# Patient Record
Sex: Male | Born: 1955 | Race: White | Hispanic: No | Marital: Married | Smoking: Never smoker
Health system: Southern US, Community
[De-identification: ages and names within clinical notes are randomized; demographics above are authoritative.]

## PROBLEM LIST (undated history)

## (undated) DIAGNOSIS — T7840XA Allergy, unspecified, initial encounter: Secondary | ICD-10-CM

## (undated) DIAGNOSIS — R011 Cardiac murmur, unspecified: Secondary | ICD-10-CM

## (undated) HISTORY — DX: Allergy, unspecified, initial encounter: T78.40XA

## (undated) HISTORY — DX: Cardiac murmur, unspecified: R01.1

---

## 1998-11-01 ENCOUNTER — Emergency Department (HOSPITAL_COMMUNITY): Admission: EM | Admit: 1998-11-01 | Discharge: 1998-11-01 | Payer: Self-pay | Admitting: Emergency Medicine

## 1998-11-04 ENCOUNTER — Emergency Department (HOSPITAL_COMMUNITY): Admission: EM | Admit: 1998-11-04 | Discharge: 1998-11-04 | Payer: Self-pay | Admitting: Emergency Medicine

## 1998-11-08 ENCOUNTER — Encounter (HOSPITAL_COMMUNITY): Admission: RE | Admit: 1998-11-08 | Discharge: 1999-02-06 | Payer: Self-pay | Admitting: Internal Medicine

## 1999-01-08 ENCOUNTER — Ambulatory Visit (HOSPITAL_COMMUNITY): Admission: RE | Admit: 1999-01-08 | Discharge: 1999-01-08 | Payer: Self-pay | Admitting: Gastroenterology

## 1999-12-24 ENCOUNTER — Encounter: Payer: Self-pay | Admitting: General Surgery

## 1999-12-24 ENCOUNTER — Encounter: Admission: RE | Admit: 1999-12-24 | Discharge: 1999-12-24 | Payer: Self-pay | Admitting: General Surgery

## 1999-12-25 ENCOUNTER — Ambulatory Visit (HOSPITAL_BASED_OUTPATIENT_CLINIC_OR_DEPARTMENT_OTHER): Admission: RE | Admit: 1999-12-25 | Discharge: 1999-12-26 | Payer: Self-pay | Admitting: General Surgery

## 2004-08-17 ENCOUNTER — Encounter: Admission: RE | Admit: 2004-08-17 | Discharge: 2004-11-15 | Payer: Self-pay | Admitting: Internal Medicine

## 2011-11-06 ENCOUNTER — Other Ambulatory Visit: Payer: Self-pay | Admitting: Gastroenterology

## 2013-04-30 ENCOUNTER — Ambulatory Visit: Payer: 59 | Admitting: Internal Medicine

## 2013-04-30 VITALS — BP 152/84 | HR 76 | Temp 99.3°F | Resp 16 | Ht 70.0 in | Wt 232.0 lb

## 2013-04-30 DIAGNOSIS — K047 Periapical abscess without sinus: Secondary | ICD-10-CM

## 2013-04-30 DIAGNOSIS — H9209 Otalgia, unspecified ear: Secondary | ICD-10-CM

## 2013-04-30 MED ORDER — AMOXICILLIN 500 MG PO CAPS: 1000.0000 mg | ORAL_CAPSULE | Freq: Two times a day (BID) | ORAL | Status: AC

## 2013-04-30 NOTE — Progress Notes (Signed)
   Subjective:    Patient ID: Jesus Cole, male    DOB: Jun 29, 1955, 58 y.o.   MRN: [id number]  HPI Patient reports with a 3-4 day history of bilateral ear pain.  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  He picks the scabs which leaves raw areas then he puts cotton in his ears at night and it sticks to the raw areas.  His reports soreness in this ears going back behind the ears and into his left lower jaw.  He denies any mouth/tooth injury or sores.   He reports a history of UTI's with a bottle of cipro 500 mg in the home which he took 1 tab BID for 4 days ending yesterday with no improvement.   He reports fatigue, chills, and irritability and aching in the ear/jaw area.  He denies hearing loss, tinnitus, nasal congestion, rhinorrhea, sinus pain/pressure, and post nasal drip.  Review of Systems  Constitutional: Positive for fever, chills and fatigue. Negative for activity change and appetite change.  HENT: Positive for ear discharge and ear pain. Negative for congestion, dental problem, hearing loss, mouth sores, postnasal drip, rhinorrhea, sinus pressure, sore throat, tinnitus and trouble swallowing.   Eyes: Negative.   Respiratory: Negative.       Objective:   Physical Exam  Constitutional: He appears well-developed and well-nourished. No distress.  HENT:  Head: Normocephalic and atraumatic.  Right Ear: Tympanic membrane normal. No drainage.  Left Ear: Tympanic membrane normal. No drainage.  Nose: Nose normal.  Mouth/Throat: Oropharynx is clear and moist. No oropharyngeal exudate.  Partial cerumen impaction bilaterlly. Right external canal injected. Tenderness molar roots right upper and lower//no abscess seen  Eyes: Conjunctivae and EOM are normal. Pupils are equal, round, and reactive to light. Right eye exhibits no discharge. Left eye exhibits no discharge. No scleral icterus.      Assessment & Plan:   1.  Otalgia But no active disease in the ear canals or tympanic membrane  2. Dental abscess Possibly both upper and lower aspects of the right side  Meds ordered this encounter  Medications  . amoxicillin (AMOXIL) 500 MG capsule    Sig: Take 2 capsules (1,000 mg total) by mouth 2 (two) times daily.    Dispense:  40 capsule    Refill:  0  Followup with dentist  I have completed the patient encounter in its entirety as documented by the FNP-S, with editing by me where necessary. Indiana Gamero P. Merla Richesoolittle, M.D.

## 2014-02-13 ENCOUNTER — Ambulatory Visit (INDEPENDENT_AMBULATORY_CARE_PROVIDER_SITE_OTHER): Payer: 59 | Admitting: Physician Assistant

## 2014-02-13 VITALS — BP 118/74 | HR 80 | Temp 99.6°F | Resp 16 | Ht 71.25 in | Wt 232.0 lb

## 2014-02-13 DIAGNOSIS — N390 Urinary tract infection, site not specified: Secondary | ICD-10-CM

## 2014-02-13 DIAGNOSIS — R300 Dysuria: Secondary | ICD-10-CM

## 2014-02-13 DIAGNOSIS — R319 Hematuria, unspecified: Secondary | ICD-10-CM

## 2014-02-13 LAB — POCT URINALYSIS DIPSTICK
Bilirubin, UA: NEGATIVE
Glucose, UA: NEGATIVE
Ketones, UA: NEGATIVE
Nitrite, UA: NEGATIVE
Protein, UA: NEGATIVE
Spec Grav, UA: 1.01
Urobilinogen, UA: 0.2
pH, UA: 6

## 2014-02-13 LAB — POCT UA - MICROSCOPIC ONLY
Casts, Ur, LPF, POC: NEGATIVE
Crystals, Ur, HPF, POC: NEGATIVE
Mucus, UA: NEGATIVE
Yeast, UA: NEGATIVE

## 2014-02-13 MED ORDER — CIPROFLOXACIN HCL 500 MG PO TABS: 500.0000 mg | ORAL_TABLET | Freq: Two times a day (BID) | ORAL | Status: AC

## 2014-02-13 NOTE — Patient Instructions (Signed)
Please let your pcp know that you are being treated.   We will contact you about the cultures, if the ciprofloxacin is not covering your medicaitons.

## 2014-02-13 NOTE — Progress Notes (Addendum)
MRN: [id number] DOB: 1955/05/12  Subjective:   Chief Complaint  Patient presents with  . Back Pain  . Urinary Frequency    Jesus Cole is a 58 y.o. non-smoking male presenting for urgency but voiding less than normal.  He noticed that the urine was cloudy but has since resolved.  He denies dysuria, nausea, vomiting, or fever.  He does have some back pain of left side.  He denies frequency, incontinence or dribbling.  Patient has not taken any medication or taken any measures for relief.  He notes that he has similar symptoms with a uti on two additional occasions.  He has not seen a urologist with this uti hx.  Patient reports that he has a prostate exam yearly with a PSA as well, which have always been reported to him as normal.  PCP is Dr. Tori MilksEdwin Greene.  He reports that he was circumcised, but notes that when the penis is flaccid, he does have considerable foreskin that covers the penis.  He denies any abnormal bowel movements or odd penile discharge.  He is sexually active with wife.  He notes that he has had full workups annually with labs and has never had concern for diabetes mellitus.  He denies polyuria, polydipsia, diarrhea, or blurriness.    Jesus Cole has a current medication list which includes the following prescription(s): ciprofloxacin and lisinopril-hydrochlorothiazide.  He is allergic to bee venom.    Jesus Cole  has a past medical history of Allergy and Heart murmur. Also  has no past surgical history on file.  ROS As in subjective.  Objective:   Vitals: BP 118/74 mmHg  Pulse 80  Temp(Src) 99.6 F (37.6 C) (Oral)  Resp 16  Ht 5' 11.25" (1.81 m)  Wt 232 lb (105.235 kg)  BMI 32.12 kg/m2  SpO2 97%  Physical Exam  Constitutional: He is well-developed, well-nourished, and in no distress. No distress.  HENT:  Head: Normocephalic and atraumatic.  Eyes: Conjunctivae are normal. Pupils are equal, round, and reactive to light.  Cardiovascular: Normal rate, regular  rhythm and normal heart sounds.  Exam reveals no gallop and no friction rub.   No murmur heard. Pulmonary/Chest: Effort normal and breath sounds normal. No respiratory distress. He has no wheezes.  Abdominal: Soft. Bowel sounds are normal. He exhibits no mass. There is no tenderness. There is CVA tenderness (Left sided).  Nursing note and vitals reviewed.    Results for orders placed or performed in visit on 02/13/14  POCT urinalysis dipstick  Result Value Ref Range   Color, UA yellow    Clarity, UA clear    Glucose, UA neg    Bilirubin, UA neg    Ketones, UA neg    Spec Grav, UA 1.010    Blood, UA trace    pH, UA 6.0    Protein, UA neg    Urobilinogen, UA 0.2    Nitrite, UA neg    Leukocytes, UA moderate (2+)   POCT UA - Microscopic Only  Result Value Ref Range   WBC, Ur, HPF, POC 10-15    RBC, urine, microscopic 1-3    Bacteria, U Microscopic small    Mucus, UA neg    Epithelial cells, urine per micros 1-4    Crystals, Ur, HPF, POC neg    Casts, Ur, LPF, POC neg    Yeast, UA neg     Assessment and Plan :  58 year old male is here today for urinary frequency and back  discomfort.  Dysuria - Plan: POCT urinalysis dipstick, POCT UA - Microscopic Only, Urine culture, ciprofloxacin (CIPRO) 500 MG tablet  Urinary tract infection with hematuria, site unspecified - Plan: Urine culture, ciprofloxacin (CIPRO) 500 MG tablet BID 10 days.  Culture sent, and will confirm antibiotic coverage.  Advised to consult with pcp for urology.  This is the 3 rd occurrence within 4 years, and is more rare in men.  If the prostate is normal per patient report, my suspicion is that it could be due to foreskin encasement.    Trena PlattStephanie Parnell Spieler, PA-C [facility] 12/27/20154:40 PM

## 2014-02-15 LAB — URINE CULTURE: Colony Count: >=100000

## 2014-03-21 ENCOUNTER — Telehealth: Payer: Self-pay | Admitting: Physician Assistant

## 2014-03-21 NOTE — Telephone Encounter (Signed)
Contacted patient to verify that he informed pcp of infection.  Patient has not informed doctor.  Spoke with patient of results.  Patient states that they have no urinary symptoms, and would return soon after abx if he did.  Informed patient that I wanted to contact him a little later when I could verify that he actually spoke with pcp.  Patient understood, and was happy with service rendered.  Patient states that he will see his pcp in 2 weeks for physical exam, and will inform then.

## 2014-09-17 ENCOUNTER — Encounter (HOSPITAL_COMMUNITY): Payer: Self-pay | Admitting: *Deleted

## 2014-09-17 ENCOUNTER — Emergency Department (INDEPENDENT_AMBULATORY_CARE_PROVIDER_SITE_OTHER)
Admission: EM | Admit: 2014-09-17 | Discharge: 2014-09-17 | Disposition: A | Discharge status: Home / Self Care | Payer: Worker's Compensation | Source: Home / Self Care | Attending: Family Medicine | Admitting: Family Medicine

## 2014-09-17 DIAGNOSIS — T63441A Toxic effect of venom of bees, accidental (unintentional), initial encounter: Secondary | ICD-10-CM | POA: Diagnosis not present

## 2014-09-17 MED ORDER — DIPHENHYDRAMINE HCL 50 MG/ML IJ SOLN: 50.0000 mg | Freq: Once | INTRAMUSCULAR | Status: DC

## 2014-09-17 MED ORDER — METHYLPREDNISOLONE SODIUM SUCC 125 MG IJ SOLR
INTRAMUSCULAR | Status: AC
  Filled 2014-09-17: qty 2

## 2014-09-17 MED ORDER — METHYLPREDNISOLONE SODIUM SUCC 125 MG IJ SOLR
125.0000 mg | Freq: Once | INTRAMUSCULAR | Status: AC
  Administered 2014-09-17: 125 mg via INTRAMUSCULAR

## 2014-09-17 MED ORDER — DIPHENHYDRAMINE HCL 50 MG/ML IJ SOLN
INTRAMUSCULAR | Status: AC
  Filled 2014-09-17: qty 1

## 2014-09-17 MED ORDER — EPINEPHRINE 0.3 MG/0.3ML IJ SOAJ: 0.3000 mg | Freq: Once | INTRAMUSCULAR | Status: DC

## 2014-09-17 MED ORDER — DIPHENHYDRAMINE HCL 50 MG/ML IJ SOLN
50.0000 mg | Freq: Once | INTRAMUSCULAR | Status: AC
  Administered 2014-09-17: 50 mg via INTRAMUSCULAR

## 2014-09-17 NOTE — ED Notes (Signed)
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

## 2014-09-17 NOTE — ED Provider Notes (Signed)
CSN: [id number]     Arrival date & time 09/17/14  1801 History   First MD Initiated Contact with Patient 09/17/14 1816     Chief Complaint  Patient presents with  . Insect Bite   (Consider location/radiation/quality/duration/timing/severity/associated sxs/prior Treatment) Patient is a 59 y.o. male presenting with rash. The history is provided by the patient.  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 concerned over h/o rxns in past. Relieved by:  None tried Worsened by:  Nothing tried Ineffective treatments:  None tried Associated symptoms: no fever, no shortness of breath, no throat swelling, no tongue swelling and not wheezing     Past Medical History  Diagnosis Date  . Allergy   . Heart murmur    History reviewed. No pertinent past surgical history. Family History  Problem Relation Age of Onset  . Stroke Maternal Grandfather   . Cancer Paternal Grandmother   . Heart disease Paternal Grandfather    History  Substance Use Topics  . Smoking status: Never Smoker   . Smokeless tobacco: Not on file  . Alcohol Use: Yes    Review of Systems  Constitutional: Negative.  Negative for fever.  Respiratory: Negative for shortness of breath and wheezing.   Cardiovascular: Negative.   Gastrointestinal: Negative.   Skin: Positive for rash.    Allergies  Bee venom  Home Medications   Prior to Admission medications   Medication Sig Start Date End Date Taking? Authorizing Provider  EPINEPHrine 0.3 mg/0.3 mL IJ SOAJ injection Inject 0.3 mLs (0.3 mg total) into the muscle once. 09/17/14   Linna Hoff, MD  lisinopril-hydrochlorothiazide (PRINZIDE,ZESTORETIC) 20-12.5 MG per  tablet Take 1 tablet by mouth daily.    Historical Provider, MD   BP 138/81 mmHg  Pulse 103  Temp(Src) 98 F (36.7 C) (Oral)  Resp 18  SpO2 97% Physical Exam  Constitutional: He is oriented to person, place, and time. He appears well-developed and well-nourished. No distress.  Neck: Normal range of motion. Neck supple.  Cardiovascular: Normal rate, regular rhythm, normal heart sounds and intact distal pulses.   Pulmonary/Chest: Effort normal and breath sounds normal.  Lymphadenopathy:    He has no cervical adenopathy.  Neurological: He is alert and oriented to person, place, and time.  Skin: Skin is warm and dry. There is erythema.  Local erythema to neck , no raised lesions.  Nursing note and vitals reviewed.   ED Course  Procedures (including critical care time) Labs Review Labs Reviewed - No data to display  Imaging Review No results found.   MDM   1. Bee sting, accidental or unintentional, initial encounter       Linna Hoff, MD 09/17/14 [phone]

## 2014-09-17 NOTE — ED Notes (Signed)
No  Staff  On  Duty  Tonight  In the  Er  Qualified  To do drug  Testing      Pt advised  he  Needs  It  Done  Tonight  He  Will  Probably  Need  To  Go  To  The  Er

## 2015-09-04 ENCOUNTER — Other Ambulatory Visit: Payer: Self-pay | Admitting: Internal Medicine

## 2015-09-04 ENCOUNTER — Ambulatory Visit
Admission: RE | Admit: 2015-09-04 | Discharge: 2015-09-04 | Disposition: A | Discharge status: Home / Self Care | Payer: 59 | Source: Ambulatory Visit | Attending: Internal Medicine | Admitting: Internal Medicine

## 2015-09-04 DIAGNOSIS — R059 Cough, unspecified: Secondary | ICD-10-CM

## 2015-09-04 DIAGNOSIS — R05 Cough: Secondary | ICD-10-CM

## 2015-09-04 DIAGNOSIS — R509 Fever, unspecified: Secondary | ICD-10-CM

## 2015-10-20 ENCOUNTER — Other Ambulatory Visit: Payer: Self-pay | Admitting: Internal Medicine

## 2015-10-20 ENCOUNTER — Ambulatory Visit
Admission: RE | Admit: 2015-10-20 | Discharge: 2015-10-20 | Disposition: A | Discharge status: Home / Self Care | Payer: 59 | Source: Ambulatory Visit | Attending: Internal Medicine | Admitting: Internal Medicine

## 2015-10-20 DIAGNOSIS — Z09 Encounter for follow-up examination after completed treatment for conditions other than malignant neoplasm: Secondary | ICD-10-CM

## 2016-01-04 ENCOUNTER — Ambulatory Visit
Admission: RE | Admit: 2016-01-04 | Discharge: 2016-01-04 | Disposition: A | Discharge status: Home / Self Care | Payer: 59 | Source: Ambulatory Visit | Attending: Internal Medicine | Admitting: Internal Medicine

## 2016-01-04 ENCOUNTER — Other Ambulatory Visit: Payer: Self-pay | Admitting: Internal Medicine

## 2016-01-04 DIAGNOSIS — J189 Pneumonia, unspecified organism: Secondary | ICD-10-CM

## 2020-01-18 ENCOUNTER — Other Ambulatory Visit: Payer: Self-pay | Admitting: Internal Medicine

## 2020-01-18 DIAGNOSIS — E785 Hyperlipidemia, unspecified: Secondary | ICD-10-CM

## 2020-02-23 ENCOUNTER — Other Ambulatory Visit: Payer: 59

## 2020-04-04 ENCOUNTER — Inpatient Hospital Stay: Admission: RE | Admit: 2020-04-04 | Payer: 59 | Source: Ambulatory Visit

## 2020-06-19 ENCOUNTER — Ambulatory Visit
Admission: RE | Admit: 2020-06-19 | Discharge: 2020-06-19 | Disposition: A | Discharge status: Home / Self Care | Payer: No Typology Code available for payment source | Source: Ambulatory Visit | Attending: Internal Medicine | Admitting: Internal Medicine

## 2020-06-19 DIAGNOSIS — E785 Hyperlipidemia, unspecified: Secondary | ICD-10-CM

## 2020-06-20 ENCOUNTER — Other Ambulatory Visit (HOSPITAL_COMMUNITY): Payer: Self-pay | Admitting: Internal Medicine

## 2020-06-20 DIAGNOSIS — R911 Solitary pulmonary nodule: Secondary | ICD-10-CM

## 2020-07-03 ENCOUNTER — Encounter (HOSPITAL_COMMUNITY): Payer: Self-pay

## 2020-07-03 ENCOUNTER — Encounter (HOSPITAL_COMMUNITY): Admission: RE | Admit: 2020-07-03 | Payer: Medicare Other | Source: Ambulatory Visit

## 2020-07-11 ENCOUNTER — Other Ambulatory Visit: Payer: Self-pay

## 2020-07-11 ENCOUNTER — Ambulatory Visit (HOSPITAL_COMMUNITY)
Admission: RE | Admit: 2020-07-11 | Discharge: 2020-07-11 | Disposition: A | Discharge status: Home / Self Care | Payer: Medicare Other | Source: Ambulatory Visit | Attending: Internal Medicine | Admitting: Internal Medicine

## 2020-07-11 DIAGNOSIS — I700 Atherosclerosis of aorta: Secondary | ICD-10-CM | POA: Insufficient documentation

## 2020-07-11 DIAGNOSIS — K760 Fatty (change of) liver, not elsewhere classified: Secondary | ICD-10-CM | POA: Insufficient documentation

## 2020-07-11 DIAGNOSIS — R911 Solitary pulmonary nodule: Secondary | ICD-10-CM | POA: Diagnosis not present

## 2020-07-11 DIAGNOSIS — I2510 Atherosclerotic heart disease of native coronary artery without angina pectoris: Secondary | ICD-10-CM | POA: Diagnosis not present

## 2020-07-11 DIAGNOSIS — K8020 Calculus of gallbladder without cholecystitis without obstruction: Secondary | ICD-10-CM | POA: Insufficient documentation

## 2020-07-11 LAB — GLUCOSE, CAPILLARY: Glucose-Capillary: 105 mg/dL — ABNORMAL HIGH (ref 70–99)

## 2020-07-11 MED ORDER — FLUDEOXYGLUCOSE F - 18 (FDG) INJECTION
12.0000 | Freq: Once | INTRAVENOUS | Status: AC | PRN
  Administered 2020-07-11: 11.9 via INTRAVENOUS

## 2020-07-27 ENCOUNTER — Other Ambulatory Visit: Payer: Self-pay

## 2020-07-27 ENCOUNTER — Ambulatory Visit (INDEPENDENT_AMBULATORY_CARE_PROVIDER_SITE_OTHER): Payer: Medicare Other | Admitting: Emergency Medicine

## 2020-07-27 ENCOUNTER — Encounter: Payer: Self-pay | Admitting: Emergency Medicine

## 2020-07-27 VITALS — BP 130/76 | HR 88 | Temp 97.4°F | Ht 71.25 in | Wt 244.6 lb

## 2020-07-27 DIAGNOSIS — R911 Solitary pulmonary nodule: Secondary | ICD-10-CM | POA: Diagnosis not present

## 2020-07-27 NOTE — Progress Notes (Signed)
Subjective:    Patient ID: Jesus Cole, male    DOB: 1955/09/24, 65 y.o.   MRN: [id number]  HPI 65 year old never smoker with a history of hypertension, allergic rhinitis, hyperlipidemia, GERD. He has had PNA in the past, last was 2 yrs ago. No hx of auto-immune disease.   He is active, has no CP, no cough or wheeze.   He underwent cardiac calcium scoring CT scan on 06/19/2020 that I have reviewed and which showed a irregularly shaped 1.1 x 1.5 x 0.8 macrolobulated left lower lobe pulmonary nodule.  PET scan done 07/07/2020 reviewed by me, showed the left lower lobe pulmonary nodule measured at 1.8 cm.  There was mild hypermetabolism (SUV max 2.9) but below the mediastinal blood pool.    Review of Systems As per HPI  Past Medical History:  Diagnosis Date   Allergy    Heart murmur      Family History  Problem Relation Age of Onset   Stroke Maternal Grandfather    Cancer Paternal Grandmother    Heart disease Paternal Grandfather      Social History   Socioeconomic History   Marital status: Married    Spouse name: Not on file   Number of children: Not on file   Years of education: Not on file   Highest education level: Not on file  Occupational History   Not on file  Tobacco Use   Smoking status: Never    Passive exposure: Never   Smokeless tobacco: Never  Substance and Sexual Activity   Alcohol use: Yes   Drug use: No   Sexual activity: Not on file  Other Topics Concern   Not on file  Social History Narrative   Not on file   Social Determinants of Health   Financial Resource Strain: Not on file  Food Insecurity: Not on file  Transportation Needs: Not on file  Physical Activity: Not on file  Stress: Not on file  Social Connections: Not on file  Intimate Partner Violence: Not on file   From [place], [place] Has worked in office, in past has worked outdoor maintenance. Exposed to aerosol cleaners. No military Some mold in his home, eradicated 4-5 yrs ago.   No animals, never birds No hot tub No TB hx or exposure.   Allergies  Allergen Reactions   Bee Venom Swelling     Outpatient Medications Prior to Visit  Medication Sig Dispense Refill   lisinopril-hydrochlorothiazide (ZESTORETIC) 20-25 MG tablet Take 1 tablet by mouth daily.     omeprazole (PRILOSEC) 20 MG capsule Take 1 capsule by mouth daily.     EPINEPHrine 0.3 mg/0.3 mL IJ SOAJ injection Inject 0.3 mLs (0.3 mg total) into the muscle once. 1 Device 1   lisinopril-hydrochlorothiazide (PRINZIDE,ZESTORETIC) 20-12.5 MG per tablet Take 1 tablet by mouth daily.     No facility-administered medications prior to visit.        Objective:   Physical Exam Vitals:   07/27/20 1353  BP: 130/76  Pulse: 88  Temp: (!) 97.4 F (36.3 C)  TempSrc: Temporal  SpO2: 98%  Weight: 244 lb 9.6 oz (110.9 kg)  Height: 5' 11.25" (1.81 m)   Gen: Pleasant, well-nourished, in no distress,  normal affect  ENT: No lesions,  mouth clear,  oropharynx clear, no postnasal drip  Neck: No JVD, no stridor  Lungs: No use of accessory muscles, no crackles or wheezing on normal respiration, no wheeze on forced expiration  Cardiovascular: RRR, heart sounds normal,  no murmur or gallops, no peripheral edema  Musculoskeletal: No deformities, no cyanosis or clubbing  Neuro: alert, awake, non focal  Skin: Warm, no lesions or rash      Assessment & Plan:  Solitary pulmonary nodule Macrolobulated left lower lobe solitary pulmonary nodule spiritually found on cardiac CT.  Mention is made of some hypermetabolism on PET scan but it is actually below the mediastinal blood pool with an SUV max of 1.8.  Discussed with him the potential causes ranging from infectious, inflammatory, scar, benign or malignant tumors.  Did explain that it was most likely benign but he is to be followed.  We will repeat his CT in 6 months.  If there is been an interval change then we will discuss either navigational bronchoscopy or  possibly primary resection which may be the best solution since he has no history of tobacco, intact lung function.  We reviewed your CT scan of the chest and your PET scan today We will perform lab work today We will repeat your CT scan of the chest without contrast in November 2022 to follow your small pulmonary nodule for stability. Follow Dr. Delton Coombes in November after your CT so that we can review the results together.   Levy Pupa, MD, PhD 07/27/2020, 2:38 PM [facility] [phone] or if no answer before 7:00PM call [phone] For any issues after 7:00PM please call eLink [phone]

## 2020-07-27 NOTE — Addendum Note (Signed)
Addended by: Demetrio Lapping E on: 07/27/2020 02:42 PM   Modules accepted: Orders

## 2020-07-27 NOTE — Assessment & Plan Note (Signed)
Macrolobulated left lower lobe solitary pulmonary nodule spiritually found on cardiac CT.  Mention is made of some hypermetabolism on PET scan but it is actually below the mediastinal blood pool with an SUV max of 1.8.  Discussed with him the potential causes ranging from infectious, inflammatory, scar, benign or malignant tumors.  Did explain that it was most likely benign but he is to be followed.  We will repeat his CT in 6 months.  If there is been an interval change then we will discuss either navigational bronchoscopy or possibly primary resection which may be the best solution since he has no history of tobacco, intact lung function.  We reviewed your CT scan of the chest and your PET scan today We will perform lab work today We will repeat your CT scan of the chest without contrast in November 2022 to follow your small pulmonary nodule for stability. Follow Dr. Delton Coombes in November after your CT so that we can review the results together.

## 2020-07-27 NOTE — Patient Instructions (Signed)
We reviewed your CT scan of the chest and your PET scan today We will perform lab work today We will repeat your CT scan of the chest without contrast in November 2022 to follow your small pulmonary nodule for stability. Follow Dr. Delton Coombes in November after your CT so that we can review the results together.

## 2020-07-28 LAB — ANA: Anti Nuclear Antibody (ANA): NEGATIVE

## 2020-07-28 LAB — ANCA SCREEN W REFLEX TITER: ANCA Screen: NEGATIVE

## 2020-07-28 LAB — ANGIOTENSIN CONVERTING ENZYME: Angiotensin-Converting Enzyme: 9 U/L (ref 9–67)

## 2020-07-28 LAB — RHEUMATOID FACTOR: Rhuematoid fact SerPl-aCnc: <14 IU/mL (ref <14)

## 2020-07-31 LAB — HYPERSENSITIVITY PNEUMONITIS
A. Pullulans Abs: NEGATIVE
A.Fumigatus #1 Abs: NEGATIVE
Micropolyspora faeni, IgG: NEGATIVE
Pigeon Serum Abs: NEGATIVE
Thermoact. Saccharii: NEGATIVE
Thermoactinomyces vulgaris, IgG: NEGATIVE

## 2021-01-16 ENCOUNTER — Other Ambulatory Visit: Payer: Self-pay

## 2021-01-16 ENCOUNTER — Ambulatory Visit (INDEPENDENT_AMBULATORY_CARE_PROVIDER_SITE_OTHER)
Admission: RE | Admit: 2021-01-16 | Discharge: 2021-01-16 | Disposition: A | Discharge status: Home / Self Care | Payer: Medicare Other | Source: Ambulatory Visit | Attending: Emergency Medicine | Admitting: Emergency Medicine

## 2021-01-16 DIAGNOSIS — R911 Solitary pulmonary nodule: Secondary | ICD-10-CM | POA: Diagnosis not present

## 2021-02-13 ENCOUNTER — Telehealth: Payer: Self-pay | Admitting: Emergency Medicine

## 2021-02-13 DIAGNOSIS — R911 Solitary pulmonary nodule: Secondary | ICD-10-CM

## 2021-02-14 NOTE — Telephone Encounter (Signed)
Called and spoke with patient, advised him that I would send a message to Dr. Delton Coombes letting him know that he is asking about the results of his CT scan from 4 weeks ago.  I let him know we would call him back after we hear back from Dr. Delton Coombes.  He verbalized understanding.  Dr. Delton Coombes, Please advise regarding results of CT scan.  Thank you.

## 2021-02-14 NOTE — Telephone Encounter (Signed)
Please let the patient know that his pulmonary nodule is unchanged in size or appearance compared with his priors.  This is good news.  He needs to have a repeat CT without contrast (super D CT) and 6 months, June 2023.

## 2021-02-14 NOTE — Telephone Encounter (Signed)
Spoke with pt and notified of results per Dr Delton Coombes  Repeat scan ordered

## 2021-08-03 ENCOUNTER — Ambulatory Visit (INDEPENDENT_AMBULATORY_CARE_PROVIDER_SITE_OTHER)
Admission: RE | Admit: 2021-08-03 | Discharge: 2021-08-03 | Disposition: A | Discharge status: Home / Self Care | Payer: Medicare Other | Source: Ambulatory Visit | Attending: Emergency Medicine | Admitting: Emergency Medicine

## 2021-08-03 DIAGNOSIS — R911 Solitary pulmonary nodule: Secondary | ICD-10-CM

## 2022-06-10 ENCOUNTER — Encounter: Payer: Self-pay | Admitting: Dermatology

## 2022-06-10 ENCOUNTER — Ambulatory Visit: Payer: Medicare Other | Admitting: Dermatology

## 2022-06-10 VITALS — BP 135/87

## 2022-06-10 DIAGNOSIS — W908XXA Exposure to other nonionizing radiation, initial encounter: Secondary | ICD-10-CM

## 2022-06-10 DIAGNOSIS — L219 Seborrheic dermatitis, unspecified: Secondary | ICD-10-CM

## 2022-06-10 DIAGNOSIS — D239 Other benign neoplasm of skin, unspecified: Secondary | ICD-10-CM

## 2022-06-10 DIAGNOSIS — L821 Other seborrheic keratosis: Secondary | ICD-10-CM | POA: Diagnosis not present

## 2022-06-10 DIAGNOSIS — L817 Pigmented purpuric dermatosis: Secondary | ICD-10-CM

## 2022-06-10 DIAGNOSIS — L570 Actinic keratosis: Secondary | ICD-10-CM

## 2022-06-10 DIAGNOSIS — L814 Other melanin hyperpigmentation: Secondary | ICD-10-CM

## 2022-06-10 DIAGNOSIS — L304 Erythema intertrigo: Secondary | ICD-10-CM

## 2022-06-10 DIAGNOSIS — X32XXXA Exposure to sunlight, initial encounter: Secondary | ICD-10-CM

## 2022-06-10 DIAGNOSIS — Z1283 Encounter for screening for malignant neoplasm of skin: Secondary | ICD-10-CM

## 2022-06-10 DIAGNOSIS — L578 Other skin changes due to chronic exposure to nonionizing radiation: Secondary | ICD-10-CM

## 2022-06-10 DIAGNOSIS — D1801 Hemangioma of skin and subcutaneous tissue: Secondary | ICD-10-CM | POA: Diagnosis not present

## 2022-06-10 DIAGNOSIS — D225 Melanocytic nevi of trunk: Secondary | ICD-10-CM

## 2022-06-10 MED ORDER — NYSTATIN-TRIAMCINOLONE 100000-0.1 UNIT/GM-% EX OINT: 1.0000 | TOPICAL_OINTMENT | Freq: Two times a day (BID) | CUTANEOUS | 2 refills | Status: AC

## 2022-06-10 MED ORDER — HYDROCORTISONE 2.5 % EX CREA: TOPICAL_CREAM | Freq: Two times a day (BID) | CUTANEOUS | 2 refills | Status: DC | PRN

## 2022-06-10 NOTE — Patient Instructions (Addendum)
Use [employer] Zinc Shampoo 3 x weekly as a regular shampoo  Nizoral shampoo can be used as a body wash once per week   Due to recent changes in healthcare laws, you may see results of your pathology and/or laboratory studies on MyChart before the doctors have had a chance to review them. We understand that in some cases there may be results that are confusing or concerning to you. Please understand that not all results are received at the same time and often the doctors may need to interpret multiple results in order to provide you with the best plan of care or course of treatment. Therefore, we ask that you please give [place] 2 business days to thoroughly review all your results before contacting the office for clarification. Should we see a critical lab result, you will be contacted sooner.   If You Need Anything After Your Visit  If you have any questions or concerns for your doctor, please call our main line at [phone] If no one answers, please leave a voicemail as directed and we will return your call as soon as possible. Messages left after 4 pm will be answered the following business day.   You may also send [place] a message via MyChart. We typically respond to MyChart messages within 1-2 business days.  For prescription refills, please ask your pharmacy to contact our office. Our fax number is [phone].  If you have an urgent issue when the clinic is closed that cannot wait until the next business day, you can page your doctor at the number below.    Please note that while we do our best to be available for urgent issues outside of office hours, we are not available 24/7.   If you have an urgent issue and are unable to reach [place], you may choose to seek medical care at your doctor's office, retail clinic, urgent care center, or emergency room.  If you have a medical emergency, please immediately call 911 or go to the emergency department. In the event of inclement weather, please call our  main line at [phone] for an update on the status of any delays or closures.  Dermatology Medication Tips: Please keep the boxes that topical medications come in in order to help keep track of the instructions about where and how to use these. Pharmacies typically print the medication instructions only on the boxes and not directly on the medication tubes.   If your medication is too expensive, please contact our office at [phone] or send [place] a message through MyChart.   We are unable to tell what your co-pay for medications will be in advance as this is different depending on your insurance coverage. However, we may be able to find a substitute medication at lower cost or fill out paperwork to get insurance to cover a needed medication.   If a prior authorization is required to get your medication covered by your insurance company, please allow [place] 1-2 business days to complete this process.  Drug prices often vary depending on where the prescription is filled and some pharmacies may offer cheaper prices.  The website www.goodrx.com contains coupons for medications through different pharmacies. The prices here do not account for what the cost may be with help from insurance (it may be cheaper with your insurance), but the website can give you the price if you did not use any insurance.  - You can print the associated coupon and take it with your prescription to the pharmacy.  -  You may also stop by our office during regular business hours and pick up a GoodRx coupon card.  - If you need your prescription sent electronically to a different pharmacy, notify our office through [facility] or by phone at [phone]    Skin Education :   I counseled the patient regarding the following: Sun screen (SPF 30 or greater) should be applied during peak UV exposure (between 10am and 2pm) and reapplied after exercise or swimming.  The ABCDEs of melanoma were reviewed with the patient,  and the importance of monthly self-examination of moles was emphasized. Should any moles change in shape or color, or itch, bleed or burn, pt will contact our office for evaluation sooner then their interval appointment.  Plan: Sunscreen Recommendations I recommended a broad spectrum sunscreen with a SPF of 30 or higher. I explained that SPF 30 sunscreens block approximately 97 percent of the sun's harmful rays. Sunscreens should be applied at least 15 minutes prior to expected sun exposure and then every 2 hours after that as long as sun exposure continues. If swimming or exercising sunscreen should be reapplied every 45 minutes to an hour after getting wet or sweating. One ounce, or the equivalent of a shot glass full of sunscreen, is adequate to protect the skin not covered by a bathing suit. I also recommended a lip balm with a sunscreen as well. Sun protective clothing can be used in lieu of sunscreen but must be worn the entire time you are exposed to the sun's rays. Cryotherapy Aftercare  Wash gently with soap and water everyday.   Apply Vaseline and Band-Aid daily until healed.

## 2022-06-10 NOTE — Progress Notes (Unsigned)
New Patient Visit   Subjective  Jesus Cole is a 67 y.o. male who presents for the following: Skin Cancer Screening and Full Body Skin Exam.  Intermittent rashes on face, along eyelashes, scalp, behind ears, antecubital fossa's. He is using Triamcinolone 0.1% cream on face, eyelids, behind ears. He is using Hydrocortisone 1% cream for arm creases intermittently x 7 years. Scalp symptoms x 6 months. Very itchy and dry. He has used Nizoral shampoo and is now Neutrogena T gel shampoo.  He has had intermittent rash in the groin x 7 years which has been flared up x 6 months. Previous treatment was with Fluconazole  tabs for 3 weeks. And Nystatin powder which he is currently using. Previously treated by Dr. Sherryl Barters office.   The patient presents for Total-Body Skin Exam (TBSE) for skin cancer screening and mole check. The patient has spots, moles and lesions to be evaluated, some may be new or changing and the patient has concerns that these could be cancer.   No personal or family history of skin cancer.    The following portions of the chart were reviewed this encounter and updated as appropriate: medications, allergies, medical history  Review of Systems:  No other skin or systemic complaints except as noted in HPI or Assessment and Plan.  Objective  Well appearing patient in no apparent distress; mood and affect are within normal limits.  A full examination was performed including scalp, head, eyes, ears, nose, lips, neck, chest, axillae, abdomen, back, buttocks, bilateral upper extremities, bilateral lower extremities, hands, feet, fingers, toes, fingernails, and toenails. All findings within normal limits unless otherwise noted below.   Relevant physical exam findings are noted in the Assessment and Plan.    Assessment & Plan   LENTIGINES, SEBORRHEIC KERATOSES, HEMANGIOMAS - Benign normal skin lesions - Benign-appearing - Call for any changes  MELANOCYTIC  NEVI - Tan-brown and/or pink-flesh-colored symmetric macules and papules - Benign appearing on exam today - Observation - Call clinic for new or changing moles - Recommend daily use of broad spectrum spf 30+ sunscreen to sun-exposed areas.   ACTINIC DAMAGE - Chronic condition, secondary to cumulative UV/sun exposure - diffuse scaly erythematous macules with underlying dyspigmentation - Recommend daily broad spectrum sunscreen SPF 30+ to sun-exposed areas, reapply every 2 hours as needed.  - Staying in the shade or wearing long sleeves, sun glasses (UVA+UVB protection) and wide brim hats (4-inch brim around the entire circumference of the hat) are also recommended for sun protection.  - Call for new or changing lesions.  SKIN CANCER SCREENING PERFORMED TODAY.   SEBORRHEIC DERMATITIS Exam: Pink patches with greasy scale at scalp, behind ears, and eyelids.  Stable  Seborrheic Dermatitis is a chronic persistent rash characterized by pinkness and scaling most commonly of the mid face but also can occur on the scalp (dandruff), ears; mid chest, mid back and groin.  It tends to be exacerbated by stress and cooler weather.  People who have neurologic disease may experience new onset or exacerbation of existing seborrheic dermatitis.  The condition is not curable but treatable and can be controlled.  Treatment Plan: Hydrocortisone 2.5% cream 2 x daily for 2-3 days when needed.  DHS Zinc Shampoo 3 x weekly  ACTINIC KERATOSIS Exam: Erythematous thin papules/macules with gritty scale  Actinic keratoses are precancerous spots that appear secondary to cumulative UV radiation exposure/sun exposure over time. They are chronic with expected duration over 1 year. A portion of actinic keratoses will  progress to squamous cell carcinoma of the skin. It is not possible to reliably predict which spots will progress to skin cancer and so treatment is recommended to prevent development of skin  cancer.  Recommend daily broad spectrum sunscreen SPF 30+ to sun-exposed areas, reapply every 2 hours as needed.  Recommend staying in the shade or wearing long sleeves, sun glasses (UVA+UVB protection) and wide brim hats (4-inch brim around the entire circumference of the hat). Call for new or changing lesions.  Treatment Plan:  Prior to procedure, discussed risks of blister formation, small wound, skin dyspigmentation, or rare scar following cryotherapy. Recommend Vaseline ointment to treated areas while healing.  Destruction Procedure Note Destruction method: cryotherapy   Informed consent: discussed and consent obtained   Lesion destroyed using liquid nitrogen: Yes   Outcome: patient tolerated procedure well with no complications   Post-procedure details: wound care instructions given   Locations: right forearm x 1, left forearm x 1 # of Lesions Treated: 2     Schamberg's disease Exam: dull erythema and hyperpigmentation on lower legs  Treatment Plan: Reassurance. Benign. Compression stockings advised.   DERMATOFIBROMA Exam: Firm pink/brown papulenodule with dimple sign on the back Treatment Plan: A dermatofibroma is a benign growth possibly related to trauma, such as an insect bite, cut from shaving, or inflamed acne-type bump.  Treatment options to remove include shave or excision with resulting scar and risk of recurrence.  Since benign-appearing and not bothersome, will observe for now.    INTERTRIGO Exam Erythematous macerated patches  Flared  Intertrigo is a chronic recurrent rash that occurs in skin fold areas that may be associated with friction; heat; moisture; yeast; fungus; and bacteria.  It is exacerbated by increased movement / activity; sweating; and higher atmospheric temperature.  Treatment Plan Nystatin/Triamcinolone ointment 2 x daily for 1 week then resume Nystatin powder. Nizoral shampoo as body wash 1 x weekly    No follow-ups on file.  Jaclynn Guarneri, CMA, am acting as scribe for Langston Reusing, MD.   Documentation: I have reviewed the above documentation for accuracy and completeness, and I agree with the above.  Langston Reusing, MD

## 2022-11-15 ENCOUNTER — Other Ambulatory Visit: Payer: Self-pay

## 2022-11-15 ENCOUNTER — Emergency Department (HOSPITAL_COMMUNITY)
Admission: EM | Admit: 2022-11-15 | Discharge: 2022-11-15 | Disposition: A | Discharge status: Home / Self Care | Payer: Medicare Other | Attending: Emergency Medicine | Admitting: Emergency Medicine

## 2022-11-15 ENCOUNTER — Encounter (HOSPITAL_COMMUNITY): Payer: Self-pay | Admitting: *Deleted

## 2022-11-15 DIAGNOSIS — Y93G1 Activity, food preparation and clean up: Secondary | ICD-10-CM | POA: Insufficient documentation

## 2022-11-15 DIAGNOSIS — W260XXA Contact with knife, initial encounter: Secondary | ICD-10-CM | POA: Diagnosis not present

## 2022-11-15 DIAGNOSIS — S61012A Laceration without foreign body of left thumb without damage to nail, initial encounter: Secondary | ICD-10-CM | POA: Insufficient documentation

## 2022-11-15 MED ORDER — LIDOCAINE HCL (PF) 1 % IJ SOLN
5.0000 mL | Freq: Once | INTRAMUSCULAR | Status: AC
  Administered 2022-11-15: 5 mL
  Filled 2022-11-15: qty 5

## 2022-11-15 MED ORDER — CEPHALEXIN 500 MG PO CAPS: 500.0000 mg | ORAL_CAPSULE | Freq: Three times a day (TID) | ORAL | 0 refills | Status: AC

## 2022-11-15 NOTE — ED Provider Notes (Signed)
[place] EMERGENCY DEPARTMENT AT [facility] Provider Note   CSN: [id number] Arrival date & time: 11/15/22  1737     History  Chief Complaint  Patient presents with   Laceration    Jesus Cole is a 67 y.o. male.  67 year old male presents today for laceration on left thumb.  This occurred around 4 PM as he was cleaning his knife.  He states he attempted to achieve hemostasis but the bleeding continued.  He states his tetanus shot was updated within the past 5 years.  He denies any other complaints.  The history is provided by the patient. No language interpreter was used.       Home Medications Prior to Admission medications   Medication Sig Start Date End Date Taking? Authorizing Provider  cephALEXin (KEFLEX) 500 MG capsule Take 1 capsule (500 mg total) by mouth 3 (three) times daily for 7 days. 11/15/22 11/22/22 Yes Harvest Stanco, PA-C  Glucosamine-Chondroitin 750-600 MG CHEW Chew 1 tablet by mouth 2 (two) times daily.    [provider]  hydrocortisone 2.5 % cream Apply topically 2 (two) times daily as needed (Rash). Apply two times daily to eyelids, arm creases, and behind ears for one week as needed for flares 06/10/22   Terri Piedra, DO  Lutein-Zeaxanthin 25-5 MG CAPS Take 1 tablet by mouth daily.    [provider]  Magnesium 250 MG TABS Take 1 tablet by mouth daily.    [provider]  nystatin (MYCOSTATIN/NYSTOP) powder SMARTSIG:1 Application Topical 2-3 Times Daily 02/11/22   [provider]  nystatin-triamcinolone ointment (MYCOLOG) Apply 1 Application topically 2 (two) times daily. Apply to groin and buttocks twice daily for one week as needed for flares 06/10/22   Terri Piedra, DO  olmesartan-hydrochlorothiazide (BENICAR HCT) 40-25 MG tablet Take 1 tablet by mouth daily. 03/31/22   [provider]  Omega-3 Fatty Acids (FISH OIL) 1000 MG CAPS Take 2 capsules by mouth daily.    [provider]   omeprazole (PRILOSEC) 20 MG capsule Take 1 capsule by mouth 2 (two) times a week. 05/22/20   [provider]  sildenafil (VIAGRA) 100 MG tablet Take 100 mg by mouth daily as needed for erectile dysfunction.    [provider]  zinc gluconate 50 MG tablet Take 50 mg by mouth daily.    [provider]      Allergies    Bee venom    Review of Systems   Review of Systems  Constitutional:  Negative for chills and fever.  Skin:  Positive for wound.  All other systems reviewed and are negative.   Physical Exam Updated Vital Signs BP (!) 153/89 (BP Location: Right Arm)   Pulse 88   Temp 99.2 F (37.3 C) (Oral)   Resp 18   Ht 5\' 11"  (1.803 m)   Wt 110.9 kg   SpO2 95%   BMI 34.10 kg/m  Physical Exam Vitals and nursing note reviewed.  Constitutional:      General: He is not in acute distress.    Appearance: Normal appearance. He is not ill-appearing.  HENT:     Head: Normocephalic and atraumatic.     Nose: Nose normal.  Eyes:     Conjunctiva/sclera: Conjunctivae normal.  Cardiovascular:     Rate and Rhythm: Normal rate.  Pulmonary:     Effort: Pulmonary effort is normal. No respiratory distress.  Musculoskeletal:        General: No deformity. Normal  range of motion.     Comments: Laceration noted to left distal thumb lateral to the nailbed.  Nailbed is spared.  Neurovascularly intact in the left upper extremity.  Brisk cap refill.  Minimal bleeding noted.  Skin:    Findings: No rash.  Neurological:     Mental Status: He is alert.     ED Results / Procedures / Treatments   Labs (all labs ordered are listed, but only abnormal results are displayed) Labs Reviewed - No data to display  EKG None  Radiology No results found.  Procedures .Marland KitchenLac repair Makela Niehoff  Date/Time: 11/15/2022 10:15 PM  Performed by: Marita Kansas, PA-C Authorized by: Marita Kansas, PA-C   Consent:    Consent obtained:  Verbal   Consent given by:  Patient   Risks discussed:   Need for additional repair, infection, retained foreign body, poor cosmetic result and poor wound healing   Alternatives discussed:  No treatment Universal protocol:    Procedure explained and questions answered to patient or proxy's satisfaction: yes     Relevant documents present and verified: yes     Patient identity confirmed:  Verbally with patient and arm band Laceration details:    Length (cm):  1 Pre-procedure details:    Preparation:  Patient was prepped and draped in usual sterile fashion Treatment:    Area cleansed with:  Saline and povidone-iodine   Amount of cleaning:  Extensive   Irrigation solution:  Sterile saline   Irrigation method:  Tap   Debridement:  None   Undermining:  None Skin repair:    Repair method:  Sutures   Suture size:  5-0   Suture material:  Prolene   Suture technique:  Simple interrupted   Number of sutures:  3 Approximation:    Approximation:  Close Repair type:    Repair type:  Simple Post-procedure details:    Dressing:  Non-adherent dressing   Procedure completion:  Tolerated well, no immediate complications     Medications Ordered in ED Medications  lidocaine (PF) (XYLOCAINE) 1 % injection 5 mL (5 mLs Infiltration Given by Other 11/15/22 2126)    ED Course/ Medical Decision Making/ A&P                                 Medical Decision Making Risk Prescription drug management.   67 year old male presents today for concern of laceration.  This is a 1 cm laceration.  Minimal bleeding.  Repaired with 3 stitches.  See the procedure note above.  Return precautions discussed.  Wound care discussed.  He is right-hand dominant.  Discharged in stable condition.  Keflex prescribed.   Final Clinical Impression(s) / ED Diagnoses Final diagnoses:  Laceration of left thumb without foreign body without damage to nail, initial encounter    Rx / DC Orders ED Discharge Orders          Ordered    cephALEXin (KEFLEX) 500 MG capsule  3 times  daily        11/15/22 2207              Marita Kansas, PA-C 11/15/22 2218    Linwood Dibbles, MD 11/16/22 [phone]

## 2022-11-15 NOTE — Discharge Instructions (Addendum)
Your laceration was repaired with 3 stitches.  These will need to be removed in 7 days.  This can be removed by your primary care doctor.  I have sent antibiotic and to limit the chance of infection.  You can also apply Neosporin.

## 2022-11-15 NOTE — ED Notes (Signed)
Pt educated to take antibiotics for entire tx regimen. Pt voiced understanding.

## 2022-11-15 NOTE — ED Notes (Signed)
ED Provider at bedside. 

## 2022-11-15 NOTE — ED Triage Notes (Signed)
The pt lacerated his lt thumb with a sharp knife around 1600 today  still bleeding but controlled

## 2022-11-15 NOTE — ED Notes (Signed)
PA back at bedside.

## 2023-01-21 ENCOUNTER — Encounter (HOSPITAL_BASED_OUTPATIENT_CLINIC_OR_DEPARTMENT_OTHER): Payer: Self-pay | Admitting: Internal Medicine

## 2023-01-21 ENCOUNTER — Ambulatory Visit (HOSPITAL_BASED_OUTPATIENT_CLINIC_OR_DEPARTMENT_OTHER): Payer: Medicare Other | Admitting: Internal Medicine

## 2023-01-21 VITALS — BP 136/70 | HR 73 | Ht 72.0 in | Wt 247.6 lb

## 2023-01-21 DIAGNOSIS — R931 Abnormal findings on diagnostic imaging of heart and coronary circulation: Secondary | ICD-10-CM

## 2023-01-21 DIAGNOSIS — I10 Essential (primary) hypertension: Secondary | ICD-10-CM

## 2023-01-21 DIAGNOSIS — E785 Hyperlipidemia, unspecified: Secondary | ICD-10-CM | POA: Diagnosis not present

## 2023-01-21 NOTE — Progress Notes (Signed)
LIPID CLINIC CONSULT NOTE  Chief Complaint:  Elevated calcium score, dyslipidemia  Primary Care Physician: Jesus Ferretti, DO  Primary Cardiologist:  None  HPI:  Jesus Cole is a 68 y.o. male who is being seen today for the evaluation of dyslipidemia at the request of Jesus Ferretti, DO. This is a 67 year old male with a history of elevated coronary artery calcium score testing done in May 2022.  The total calcium score was 424, 78th percentile for age and sex matched controls.  This demonstrated multivessel coronary artery calcification.  He is primary care provider has been working with him to encourage lipid-lowering to prevent cardiovascular disease.  After multiple discussions and evaluations, the patient notes that he had some concerns about statin therapy and wanted to consider alternatives.  He was therefore referred for further discussion of this.  His questions were fairly straightforward today regarding alternatives for lipid-lowering, including medications and dietary and lifestyle options.  PMHx:  Past Medical History:  Diagnosis Date   Allergy    Heart murmur     No past surgical history on file.  FAMHx:  Family History  Problem Relation Age of Onset   Stroke Maternal Grandfather    Cancer Paternal Grandmother    Heart disease Paternal Grandfather     SOCHx:   reports that he has never smoked. He has never been exposed to tobacco smoke. He has never used smokeless tobacco. He reports current alcohol use. He reports that he does not use drugs.  ALLERGIES:  Allergies  Allergen Reactions   Bee Venom Swelling    ROS: Pertinent items noted in HPI and remainder of comprehensive ROS otherwise negative.  HOME MEDS: Current Outpatient Medications on File Prior to Visit  Medication Sig Dispense Refill   Ascorbic Acid (VITAMIN C) 100 MG tablet Take 100 mg by mouth daily.     b complex vitamins capsule Take 1 capsule by mouth daily.     Lutein-Zeaxanthin  25-5 MG CAPS Take 1 tablet by mouth daily.     nystatin-triamcinolone ointment (MYCOLOG) Apply 1 Application topically 2 (two) times daily. Apply to groin and buttocks twice daily for one week as needed for flares 30 g 2   olmesartan-hydrochlorothiazide (BENICAR HCT) 40-25 MG tablet Take 1 tablet by mouth daily.     omeprazole (PRILOSEC) 20 MG capsule Take 1 capsule by mouth 2 (two) times a week.     sildenafil (VIAGRA) 100 MG tablet Take 100 mg by mouth daily as needed for erectile dysfunction.     zinc gluconate 50 MG tablet Take 50 mg by mouth daily.     Glucosamine-Chondroitin 750-600 MG CHEW Chew 1 tablet by mouth 2 (two) times daily. (Patient not taking: Reported on 01/21/2023)     hydrocortisone 2.5 % cream Apply topically 2 (two) times daily as needed (Rash). Apply two times daily to eyelids, arm creases, and behind ears for one week as needed for flares (Patient not taking: Reported on 01/21/2023) 28 g 2   Magnesium 250 MG TABS Take 1 tablet by mouth daily. (Patient not taking: Reported on 01/21/2023)     nystatin (MYCOSTATIN/NYSTOP) powder SMARTSIG:1 Application Topical 2-3 Times Daily (Patient not taking: Reported on 01/21/2023)     Omega-3 Fatty Acids (FISH OIL) 1000 MG CAPS Take 2 capsules by mouth daily. (Patient not taking: Reported on 01/21/2023)     No current facility-administered medications on file prior to visit.    LABS/IMAGING: No results found for this or any previous  visit (from the past 48 hour(s)). No results found.  LIPID PANEL: No results found for: "CHOL", "TRIG", "HDL", "CHOLHDL", "VLDL", "LDLCALC", "LDLDIRECT"  WEIGHTS: Wt Readings from Last 3 Encounters:  01/21/23 247 lb 9.6 oz (112.3 kg)  11/15/22 244 lb 7.8 oz (110.9 kg)  07/27/20 244 lb 9.6 oz (110.9 kg)    VITALS: BP 136/70   Pulse 73   Ht 6' (1.829 m)   Wt 247 lb 9.6 oz (112.3 kg)   SpO2 95%   BMI 33.58 kg/m   EXAM: Deferred  EKG: Deferred  ASSESSMENT: Mixed dyslipidemia, goal LDL less  than 70 Elevated CAC score 424, 78 percentile for age and sex matched controls (06/2020) Family history of coronary artery disease Hypertension  PLAN: 1.   [name] has a high calcium score and given age, hypertension and family history is at intermediate risk of coronary artery disease with a calculated [place] 10-year risk of coronary events at 16.6%.  This could be reduced substantially with lipid-lowering therapy.  My recommendation would be high intensity statin therapy to target LDL less than 70.  He is statin averse and was interested in alternatives.  I did provide him with a number of pieces of literature from the National Lipid Association regarding statin medication alternatives, but noted that other than ezetimibe, most of those medications would require at least trialing statin therapy first.  In addition, I provided dietary and lifestyle modifications that may be used in adjunct to his supplements to help lower cholesterol.  Thanks for the kind referral.  He can follow-up with [place] as needed if he should be interested in trials of therapies or if he were agreeable to statin that could certainly be provided by his primary care provider as well.  Chrystie Nose, MD, [facility], FACP  [place]  [facility]  Medical Director of the Advanced Lipid Disorders &  Cardiovascular Risk Reduction Clinic Diplomate of the American Board of Clinical Lipidology Attending Cardiologist  Direct Dial: [phone]  Fax: [phone]  Website:  [email or web address] [email or web address] 01/21/2023, 9:09 AM

## 2023-01-21 NOTE — Patient Instructions (Signed)
Medication Instructions:  Your physician recommends that you continue on your current medications as directed. Please refer to the Current Medication list given to you today.  *If you need a refill on your cardiac medications before your next appointment, please call your pharmacy*   Lab Work: NONE If you have labs (blood work) drawn today and your tests are completely normal, you will receive your results only by: MyChart Message (if you have MyChart) OR A paper copy in the mail If you have any lab test that is abnormal or we need to change your treatment, we will call you to review the results.   Testing/Procedures: NONE   Follow-Up: At [facility], you and your health needs are our priority.  As part of our continuing mission to provide you with exceptional heart care, we have created designated Provider Care Teams.  These Care Teams include your primary Cardiologist (physician) and Advanced Practice Providers (APPs -  Physician Assistants and Nurse Practitioners) who all work together to provide you with the care you need, when you need it.  We recommend signing up for the patient portal called "MyChart".  Sign up information is provided on this After Visit Summary.  MyChart is used to connect with patients for Virtual Visits (Telemedicine).  Patients are able to view lab/test results, encounter notes, upcoming appointments, etc.  Non-urgent messages can be sent to your provider as well.   To learn more about what you can do with MyChart, go to [email or web address].    Your next appointment:   As needed  Provider:   Dr. Rennis Golden

## 2023-03-07 IMAGING — CT CT CARDIAC CORONARY ARTERY CALCIUM SCORE
3 series · 14 of 20 positions shown, 16 images · non-contrast
Comparison: No priors.

CLINICAL DATA: 64-year-old Caucasian male with history of
hyperlipidemia and family history of coronary artery disease.

EXAM:
CT CARDIAC CORONARY ARTERY CALCIUM SCORE
TECHNIQUE: Non-contrast imaging through the heart was performed using
prospective ECG gating. Image post processing was performed on an
independent workstation, allowing for quantitative analysis of the
heart and coronary arteries. Note that this exam targets the heart
and the chest was not imaged in its entirety.

[Series 2: calcium scoring 2.00 qr36 bestdiast 70% hrt calciu · axial · 0.51mm/px · z∈[+1705,+1801]mm · 4 of 80 slices shown]
[im 16/80  vessel]
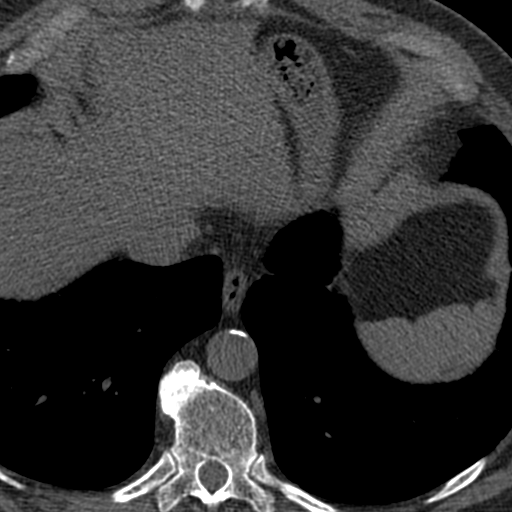
[im 32/80  vessel]
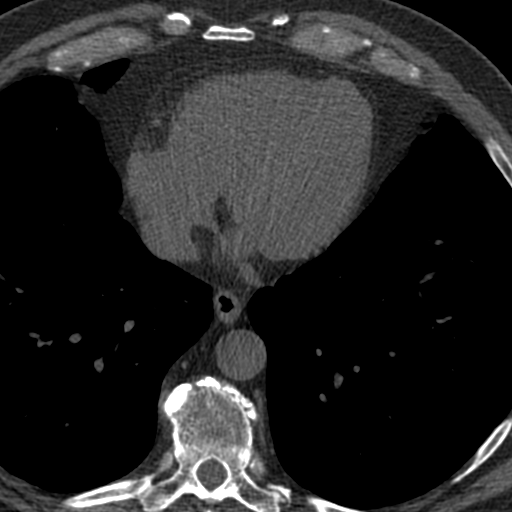
[im 48/80  vessel]
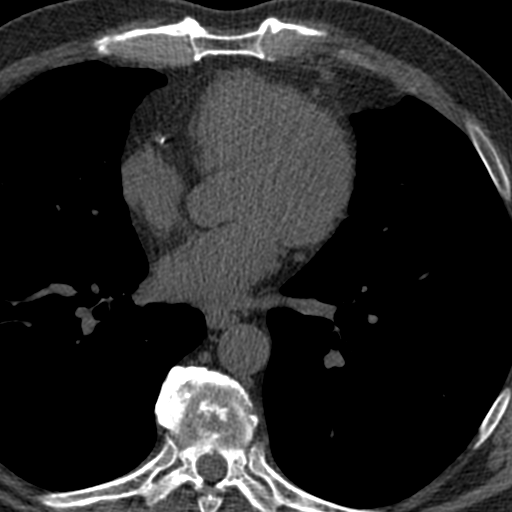
[im 64/80  vessel]
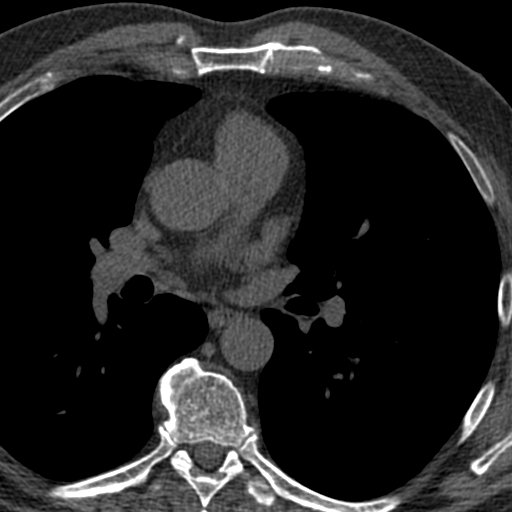

[Series 3: calcium scoring 2.00 br40 bestdiast 70% axial · axial · 0.72mm/px · z∈[+1701,+1805]mm · 5 of 80 slices shown, 7 images]
[im 14/80  vessel]
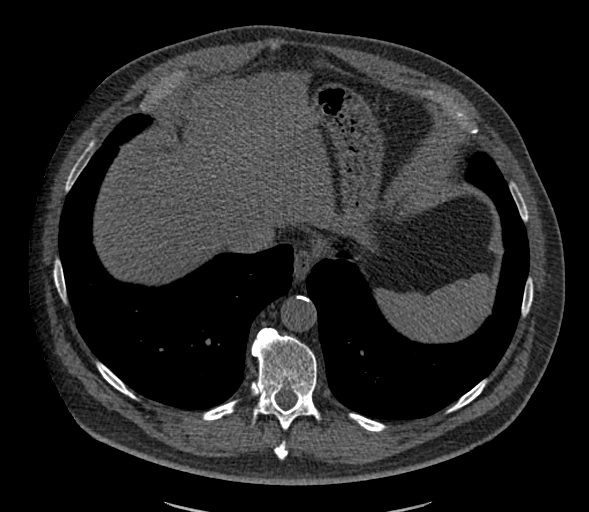
[im 14/80  lung]
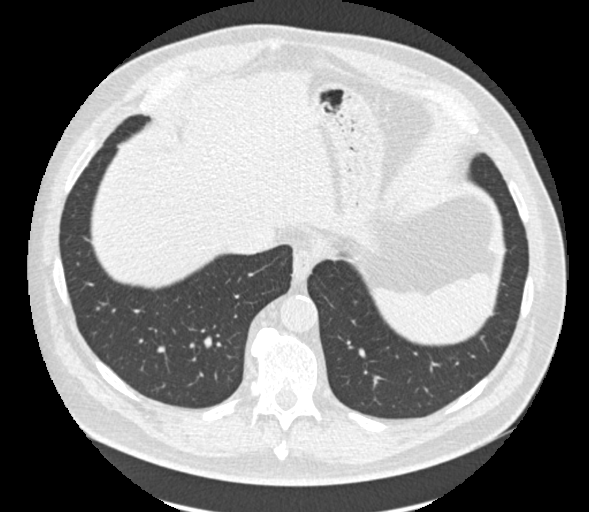
[im 27/80  vessel]
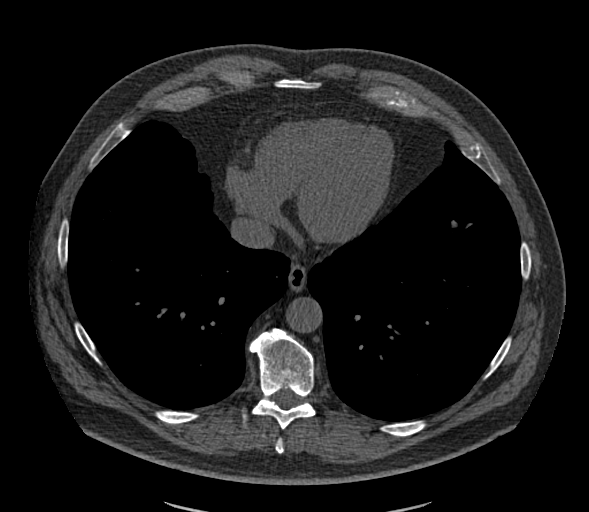
[im 40/80  vessel]
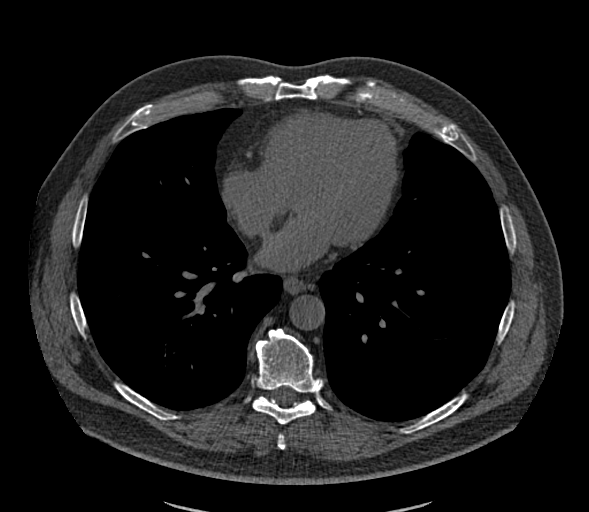
[im 53/80  vessel]
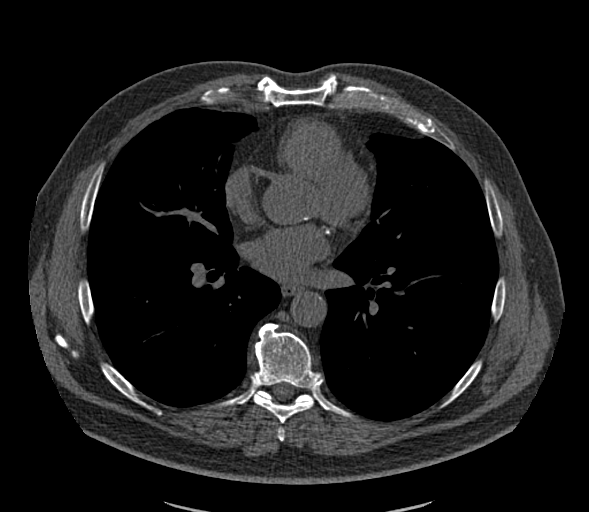
[im 66/80  vessel]
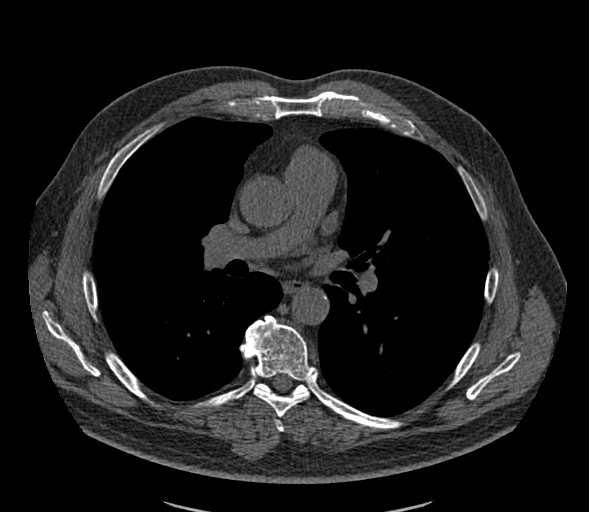
[im 66/80  lung]
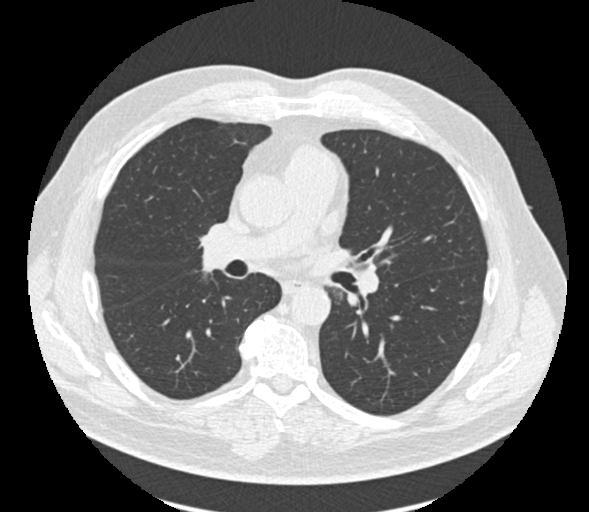

[Series 9: calcium scoring 2.00 br60 bestdiast 70% lungs · axial · 0.72mm/px · z∈[+1701,+1805]mm · 5 of 80 slices shown]
[im 14/80  vessel]
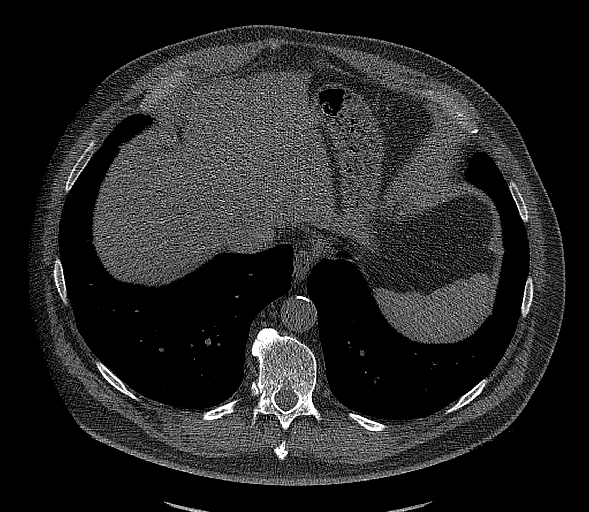
[im 27/80  vessel]
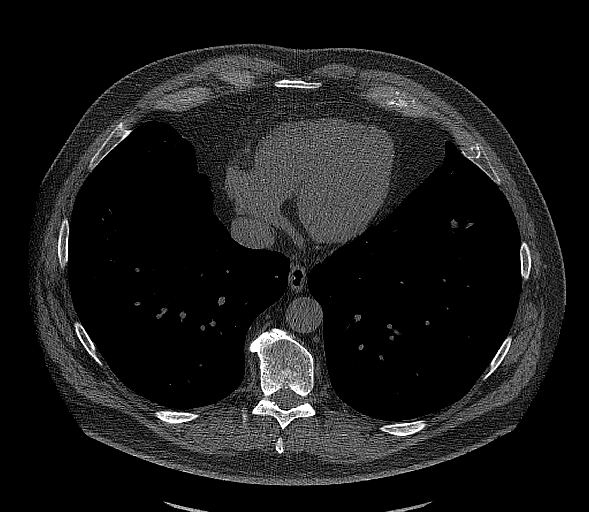
[im 40/80  vessel]
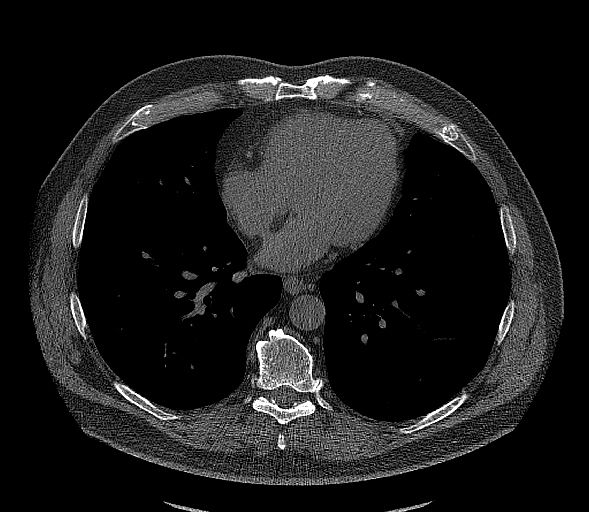
[im 53/80  vessel]
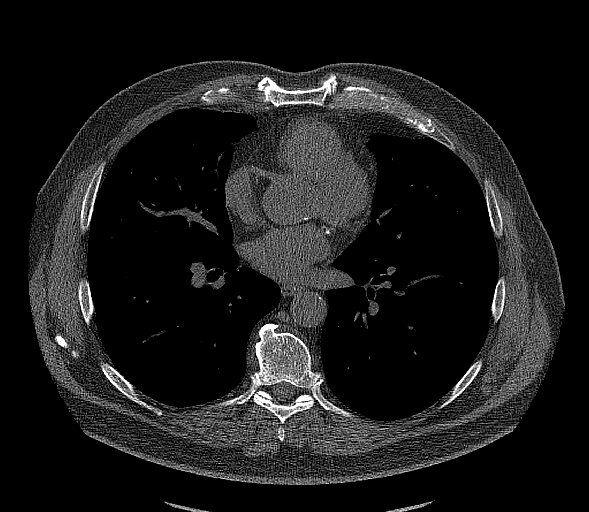
[im 66/80  vessel]
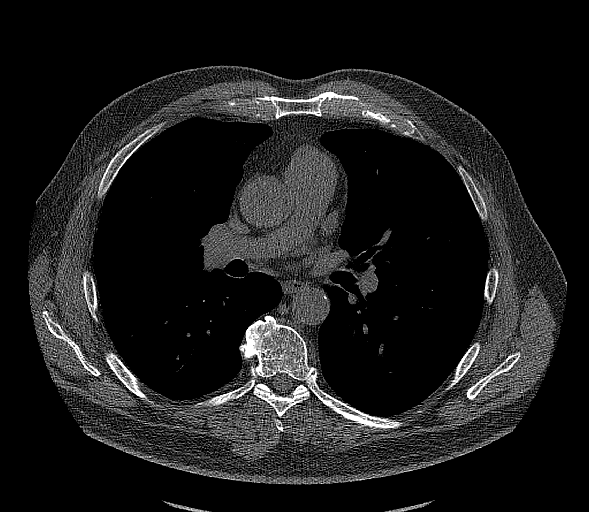

[14 of 20 positions shown; findings below may reference images not displayed]

FINDINGS: CORONARY CALCIUM SCORES:

Left Main: 6

LAD: 144

LCx: 102

RCA: 172

Total Agatston Score: 424

[HOSPITAL] percentile: 78th

AORTA MEASUREMENTS:

Ascending Aorta: 36 mm

Descending Aorta: 27 mm

EXTRACARDIAC FINDINGS:

Irregular-shaped nodule in the left lower lobe (axial image 52 of
series 9 and coronal image 83 of series 5) measuring approximately
1.1 x 1.5 x 0.8 cm with macrolobulated borders concerning for
potential small neoplasm. Atherosclerotic calcifications in the
thoracic aorta. Within the visualized portions of the thorax there
is no acute consolidative airspace disease, no pleural effusions, no
pneumothorax and no lymphadenopathy. Visualized portions of the
upper abdomen are unremarkable. There are no aggressive appearing
lytic or blastic lesions noted in the visualized portions of the
skeleton.
IMPRESSION: 1. Patient's total coronary artery calcium score is 424 which is
78th percentile for patient's of matched age, gender and
race/ethnicity. Please note that although the presence of coronary
artery calcium documents the presence of coronary artery disease,
the severity of this disease and any potential stenosis cannot be
assessed on this noncontrast CT examination. Assessment for
potential risk factor modification, dietary therapy or pharmacologic
therapy may be warranted, if clinically indicated.
2.  Aortic Atherosclerosis ([id number]).
3. 1.1 x 1.5 x 0.8 cm left lower lobe nodule concerning for
neoplasm. Further evaluation with PET-CT is recommended in the near
future to better evaluate this finding.

These results will be called to the ordering clinician or
representative by the Radiologist Assistant, and communication
documented in the PACS or [REDACTED].

## 2023-04-02 ENCOUNTER — Other Ambulatory Visit: Payer: Self-pay | Admitting: Dermatology

## 2023-09-30 ENCOUNTER — Telehealth: Payer: Self-pay

## 2023-09-30 NOTE — Telephone Encounter (Signed)
 Copied from CRM [id number]. Topic: Clinical - Request for Lab/Test Order >> Sep 30, 2023 10:12 AM Jesus Cole wrote: Reason for CRM: Pt was last seen by Dr. Shelah on 07/27/2020 so he is considered a new patient due to it being more than 3 years from his last visit.   Pt had a referral for R91.1 (ICD-10-CM) - Solitary pulmonary nodule and I scheduled for soonest available for 11/13/2023 at 830am with Dr. Shelah. Pt stated he would like to have a new CT scan completed to see if the pulm nodule has changed.   Pt's phone number is [phone] or [phone] ok to leave a vm on phones. >> Sep 30, 2023 11:02 AM Jesus Cole wrote: We would need an order for the ct to schedule will send to triage  I called and spoke to pt. I informed pt that we can not order a CT scan as he would need to see Dr Shelah first since he is now a new pt. Once pt is seen by Dr Shelah, a CT can be determined then. Pt verbalized understanding. NFN

## 2023-11-13 ENCOUNTER — Ambulatory Visit: Admitting: Emergency Medicine

## 2023-11-13 ENCOUNTER — Encounter: Payer: Self-pay | Admitting: Emergency Medicine

## 2023-11-13 VITALS — BP 125/73 | HR 67 | Temp 98.6°F | Ht 72.0 in | Wt 244.4 lb

## 2023-11-13 DIAGNOSIS — R911 Solitary pulmonary nodule: Secondary | ICD-10-CM

## 2023-11-13 NOTE — Patient Instructions (Addendum)
 We reviewed your CT scans of the chest from 2022 and 2023 We will plan for a repeat CT chest now to follow small left lower lobe pulmonary nodule Follow Dr. Shelah next available after your CT so we can review those results together.

## 2023-11-13 NOTE — Progress Notes (Signed)
 Subjective:    Patient ID: Jesus Cole, male    DOB: [birth year], 68 y.o.   MRN: [id number]  HPI Discussed the use of AI scribe software for clinical note transcription with the patient, who gave verbal consent to proceed.  History of Present Illness Jesus Cole is a 68 year old male who presents for follow-up imaging of a pulmonary nodule. He was referred by Dr. Valentin for evaluation of a pulmonary nodule.  In 2022, a cardiac calcium scoring scan revealed a 1.1 x 1.5 x 0.8 cm lobulated nodule in the left lower lobe of the lung. A PET scan on Jul 11, 2020, showed minimal hypermetabolism. Surveillance imaging was planned, and the last scan on August 03, 2021, showed the nodule was unchanged, measuring 2.0 x 1.1 cm. No follow-up scan has been done since then to confirm stability.  He experiences shortness of breath, which he attributes to weight gain, estimating an increase of 20-30 pounds since the last visit. No cough, chest pain, or wheezing, but he mentions a 'whistle' in his nose occasionally. He describes his exertion level as moderate before needing to rest due to breathing difficulties.  He has a history of repeated pneumonias, with the last episode occurring between three to five years ago. 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    Results RADIOLOGY Cardiac CT: Irregularly shaped 1.1 x 1.5 x 0.8 cm macular lobulated left lower lobe pulmonary nodule (06/19/2020) PET scan: Minimal hypermetabolism in left lower lobe pulmonary nodule (07/11/2020) Surveillance CT: Lobular irregular left lower lobe nodule unchanged in size or appearance measuring 2.0 x 1.1 cm (01/16/2021)    Review of Systems As per HPI  Past Medical History:  Diagnosis Date   Allergy    Heart murmur     Family History  Problem Relation Age of Onset   Stroke Maternal Grandfather    Cancer  Paternal Grandmother    Heart disease Paternal Grandfather      Social History   Socioeconomic History   Marital status: Married    Spouse name: Not on file   Number of children: Not on file   Years of education: Not on file   Highest education level: Not on file  Occupational History   Not on file  Tobacco Use   Smoking status: Never    Passive exposure: Never   Smokeless tobacco: Never  Substance and Sexual Activity   Alcohol use: Yes   Drug use: No   Sexual activity: Not on file  Other Topics Concern   Not on file  Social History Narrative   Not on file   Social Drivers of Health   Financial Resource Strain: Not on file  Food Insecurity: Not on file  Transportation Needs: Not on file  Physical Activity: Not on file  Stress: Not on file  Social Connections: Not on file  Intimate Partner Violence: Not on file    Allergies  Allergen Reactions   Bee Venom Swelling    Current Outpatient Medications on File Prior to Visit  Medication Sig Dispense Refill   Ascorbic Acid (VITAMIN C) 100 MG tablet Take 100 mg by mouth daily.     b complex vitamins capsule Take 1 capsule by mouth daily.     hydrocortisone  2.5 % cream APPLY TOPICALLY TWO TIMES A DAY AS NEEDED FOR RASH ON EYELIDS, ARM CREASES AND BEHIND EARS FOR  ONE WEEK AS NEEDED FOR FLARES 30 g 1   Lutein-Zeaxanthin 25-5 MG CAPS Take 1 tablet by mouth daily.     Magnesium 250 MG TABS Take 1 tablet by mouth daily.     olmesartan-hydrochlorothiazide (BENICAR HCT) 40-25 MG tablet Take 1 tablet by mouth daily.     omeprazole (PRILOSEC) 20 MG capsule Take 1 capsule by mouth 2 (two) times a week.     sildenafil (VIAGRA) 100 MG tablet Take 100 mg by mouth daily as needed for erectile dysfunction.     zinc gluconate 50 MG tablet Take 50 mg by mouth daily.     Glucosamine-Chondroitin 750-600 MG CHEW Chew 1 tablet by mouth 2 (two) times daily. (Patient not taking: Reported on 11/13/2023)     nystatin  (MYCOSTATIN /NYSTOP ) powder  SMARTSIG:1 Application Topical 2-3 Times Daily (Patient not taking: Reported on 11/13/2023)     nystatin -triamcinolone  ointment (MYCOLOG) Apply 1 Application topically 2 (two) times daily. Apply to groin and buttocks twice daily for one week as needed for flares (Patient not taking: Reported on 11/13/2023) 30 g 2   Omega-3 Fatty Acids (FISH OIL) 1000 MG CAPS Take 2 capsules by mouth daily. (Patient not taking: Reported on 11/13/2023)     No current facility-administered medications on file prior to visit.       Objective:    Vitals:   11/13/23 0820  BP: 125/73  Pulse: 67  Temp: 98.6 F (37 C)  TempSrc: Temporal  SpO2: 97%  Weight: 244 lb 6.4 oz (110.9 kg)  Height: 6' (1.829 m)   Physical Exam Gen: Pleasant, overwt man, in no distress,  normal affect  ENT: No lesions,  mouth clear,  oropharynx clear, no postnasal drip  Neck: No JVD, no stridor  Lungs: No use of accessory muscles, no crackles or wheezing on normal respiration, no wheeze on forced expiration  Cardiovascular: RRR, heart sounds normal, no murmur or gallops, no peripheral edema  Musculoskeletal: No deformities, no cyanosis or clubbing  Neuro: alert, awake, non focal  Skin: Warm, no lesions or rashes       Assessment & Plan:   Assessment & Plan Solitary pulmonary nodule   Assessment and Plan Assessment & Plan Left lower lobe pulmonary nodule Nodule stable in size over three years, likely benign due to low cancer risk profile. Surveillance exceeds typical follow-up duration for solid nodules. - Order CT scan of the chest without IV contrast to assess nodule stability. - Discuss results with him in person after the CT scan.  Shortness of breath Likely related to recent weight gain, occurs with moderate exertion.  Overweight Recent weight gain of 20-30 pounds likely contributing to shortness of breath.   Return for With Dr. Shelah, To review CT scan of the chest.   Lamar Shelah, MD, PhD 11/13/2023,  8:58 AM [facility] [phone] or if no answer before 7:00PM call [phone] For any issues after 7:00PM please call eLink [phone]

## 2023-11-21 ENCOUNTER — Ambulatory Visit
Admission: RE | Admit: 2023-11-21 | Discharge: 2023-11-21 | Disposition: A | Discharge status: Home / Self Care | Source: Ambulatory Visit | Attending: Emergency Medicine | Admitting: Emergency Medicine

## 2023-11-21 DIAGNOSIS — R911 Solitary pulmonary nodule: Secondary | ICD-10-CM

## 2023-12-04 ENCOUNTER — Encounter: Payer: Self-pay | Admitting: Emergency Medicine

## 2023-12-04 ENCOUNTER — Ambulatory Visit (INDEPENDENT_AMBULATORY_CARE_PROVIDER_SITE_OTHER): Admitting: Emergency Medicine

## 2023-12-04 VITALS — BP 126/74 | HR 69 | Temp 98.8°F | Ht 72.0 in | Wt 246.0 lb

## 2023-12-04 DIAGNOSIS — K760 Fatty (change of) liver, not elsewhere classified: Secondary | ICD-10-CM

## 2023-12-04 DIAGNOSIS — R911 Solitary pulmonary nodule: Secondary | ICD-10-CM

## 2023-12-04 DIAGNOSIS — I2510 Atherosclerotic heart disease of native coronary artery without angina pectoris: Secondary | ICD-10-CM | POA: Diagnosis not present

## 2023-12-04 NOTE — Patient Instructions (Signed)
 We reviewed your CT scan of the chest today.  Your pulmonary nodules are stable in size and appearance.  Good news.  You should not need any further surveillance imaging unless there is a clinical change in your breathing or functional capacity. Reviewed with Dr. Valentin the coronary calcifications to ensure that there is no other workup that he would recommend Follow-up with Dr. Shelah if needed for any changes in your breathing.

## 2023-12-04 NOTE — Assessment & Plan Note (Signed)
 Stable pulmonary nodules largest is 1.9 x 1.0 cm in the left lower lobe.  Question benign tumors versus scarring.  He does not need any further serial imaging unless there is a clinical change.  I reassured him about this.  He will let me know if he does develop symptoms of any kind and if so we would reimage

## 2023-12-04 NOTE — Assessment & Plan Note (Signed)
 He has had coronary artery screening, is not currently on lipid-lowering medication.  Not clear to me that he needs it but I do want him to talk to his PCP about this when they follow-up.

## 2023-12-04 NOTE — Progress Notes (Signed)
 Subjective:    Patient ID: Jesus Cole, male    DOB: 1955/09/10, 68 y.o.   MRN: [id number]  HPI Discussed the use of AI scribe software for clinical note transcription with the patient, who gave verbal consent to proceed.  History of Present Illness   ROV 12/04/2023 --follow-up visit for 68 year old gentleman with a history of pneumonias, pulmonary nodule on CT scan of the chest.  He is a never smoker.  His imaging showed a left lower lobe pulmonary nodule that have been stable in size for over 3 years without hypermetabolism on PET scan in 2022. He has questions about extra-pulm findings, so we reviewed - coronary calcifications, hepatic steatosis, etc.   Repeat CT scan of the chest 11/21/2023 reviewed by me, shows no significant change in the lobulated anterior left lower lobe nodule 1.9 x 1.0 cm or and scattered additional nodules including 6 mm infrahilar left lower lobe nodule.    Review of Systems As per HPI  Past Medical History:  Diagnosis Date   Allergy    Heart murmur     Family History  Problem Relation Age of Onset   Stroke Maternal Grandfather    Cancer Paternal Grandmother    Heart disease Paternal Grandfather      Social History   Socioeconomic History   Marital status: Married    Spouse name: Not on file   Number of children: Not on file   Years of education: Not on file   Highest education level: Not on file  Occupational History   Not on file  Tobacco Use   Smoking status: Never    Passive exposure: Never   Smokeless tobacco: Never  Substance and Sexual Activity   Alcohol use: Yes   Drug use: No   Sexual activity: Not on file  Other Topics Concern   Not on file  Social History Narrative   Not on file   Social Drivers of Health   Financial Resource Strain: Not on file  Food Insecurity: Not on file  Transportation Needs: Not on file  Physical Activity: Not on file  Stress: Not on file  Social Connections: Not on file  Intimate  Partner Violence: Not on file    Allergies  Allergen Reactions   Wasp Venom Anaphylaxis and Shortness Of Breath   Bee Venom Swelling    Current Outpatient Medications on File Prior to Visit  Medication Sig Dispense Refill   Ascorbic Acid (VITAMIN C) 100 MG tablet Take 100 mg by mouth daily.     b complex vitamins capsule Take 1 capsule by mouth daily.     Glucosamine-Chondroitin 750-600 MG CHEW Chew 1 tablet by mouth 2 (two) times daily.     hydrocortisone  2.5 % cream APPLY TOPICALLY TWO TIMES A DAY AS NEEDED FOR RASH ON EYELIDS, ARM CREASES AND BEHIND EARS FOR ONE WEEK AS NEEDED FOR FLARES 30 g 1   Lutein-Zeaxanthin 25-5 MG CAPS Take 1 tablet by mouth daily.     Magnesium 250 MG TABS Take 1 tablet by mouth daily.     olmesartan-hydrochlorothiazide (BENICAR HCT) 40-25 MG tablet Take 1 tablet by mouth daily.     omeprazole (PRILOSEC) 20 MG capsule Take 1 capsule by mouth 2 (two) times a week.     sildenafil (VIAGRA) 100 MG tablet Take 100 mg by mouth daily as needed for erectile dysfunction.     zinc gluconate 50 MG tablet Take 50 mg by mouth daily.     nystatin  (MYCOSTATIN /NYSTOP )  powder SMARTSIG:1 Application Topical 2-3 Times Daily (Patient not taking: Reported on 12/04/2023)     nystatin -triamcinolone  ointment (MYCOLOG) Apply 1 Application topically 2 (two) times daily. Apply to groin and buttocks twice daily for one week as needed for flares (Patient not taking: Reported on 12/04/2023) 30 g 2   Omega-3 Fatty Acids (FISH OIL) 1000 MG CAPS Take 2 capsules by mouth daily. (Patient not taking: Reported on 12/04/2023)     No current facility-administered medications on file prior to visit.       Objective:    Vitals:   12/04/23 1312  BP: 126/74  Pulse: 69  Temp: 98.8 F (37.1 C)  TempSrc: Oral  SpO2: 98%  Weight: 246 lb (111.6 kg)  Height: 6' (1.829 m)   Physical Exam Gen: Pleasant, overwt man, in no distress,  normal affect  ENT: No lesions,  mouth clear,  oropharynx  clear, no postnasal drip  Neck: No JVD, no stridor  Lungs: No use of accessory muscles, no crackles or wheezing on normal respiration, no wheeze on forced expiration  Cardiovascular: RRR, heart sounds normal, no murmur or gallops, no peripheral edema  Musculoskeletal: No deformities, no cyanosis or clubbing  Neuro: alert, awake, non focal  Skin: Warm, no lesions or rashes       Assessment & Plan:   Assessment & Plan Solitary pulmonary nodule   Assessment and Plan Solitary pulmonary nodule Stable pulmonary nodules largest is 1.9 x 1.0 cm in the left lower lobe.  Question benign tumors versus scarring.  He does not need any further serial imaging unless there is a clinical change.  I reassured him about this.  He will let me know if he does develop symptoms of any kind and if so we would reimage  Coronary artery calcification He has had coronary artery screening, is not currently on lipid-lowering medication.  Not clear to me that he needs it but I do want him to talk to his PCP about this when they follow-up.  Hepatic steatosis I discussed with him the potential causes for this, recommended moderation of his diet, Minimizing alcohol (which he already does), weight loss.   No follow-ups on file.   Lamar Chris, MD, PhD 12/04/2023, 1:36 PM [facility] [phone] or if no answer before 7:00PM call [phone] For any issues after 7:00PM please call eLink [phone]

## 2023-12-04 NOTE — Assessment & Plan Note (Signed)
 I discussed with him the potential causes for this, recommended moderation of his diet, Minimizing alcohol (which he already does), weight loss.
